# Patient Record
Sex: Female | Born: 1955 | Race: Black or African American | Hispanic: No | Smoking: Never smoker
Health system: Southern US, Community
[De-identification: ages and names within clinical notes are randomized; demographics above are authoritative.]

## PROBLEM LIST (undated history)

## (undated) DIAGNOSIS — I10 Essential (primary) hypertension: Secondary | ICD-10-CM

## (undated) DIAGNOSIS — E7800 Pure hypercholesterolemia, unspecified: Secondary | ICD-10-CM

## (undated) DIAGNOSIS — K219 Gastro-esophageal reflux disease without esophagitis: Secondary | ICD-10-CM

---

## 2014-12-27 ENCOUNTER — Emergency Department (HOSPITAL_BASED_OUTPATIENT_CLINIC_OR_DEPARTMENT_OTHER)
Admission: EM | Admit: 2014-12-27 | Discharge: 2014-12-27 | Disposition: A | Discharge status: Home / Self Care | Payer: No Typology Code available for payment source | Attending: Emergency Medicine | Admitting: Emergency Medicine

## 2014-12-27 ENCOUNTER — Encounter (HOSPITAL_BASED_OUTPATIENT_CLINIC_OR_DEPARTMENT_OTHER): Payer: Self-pay

## 2014-12-27 ENCOUNTER — Emergency Department (HOSPITAL_BASED_OUTPATIENT_CLINIC_OR_DEPARTMENT_OTHER): Payer: No Typology Code available for payment source

## 2014-12-27 DIAGNOSIS — S0993XA Unspecified injury of face, initial encounter: Secondary | ICD-10-CM | POA: Insufficient documentation

## 2014-12-27 DIAGNOSIS — S199XXA Unspecified injury of neck, initial encounter: Secondary | ICD-10-CM | POA: Diagnosis present

## 2014-12-27 DIAGNOSIS — S161XXA Strain of muscle, fascia and tendon at neck level, initial encounter: Secondary | ICD-10-CM | POA: Diagnosis not present

## 2014-12-27 DIAGNOSIS — S0990XA Unspecified injury of head, initial encounter: Secondary | ICD-10-CM | POA: Insufficient documentation

## 2014-12-27 DIAGNOSIS — I10 Essential (primary) hypertension: Secondary | ICD-10-CM | POA: Diagnosis not present

## 2014-12-27 DIAGNOSIS — S4991XA Unspecified injury of right shoulder and upper arm, initial encounter: Secondary | ICD-10-CM | POA: Insufficient documentation

## 2014-12-27 DIAGNOSIS — Y9389 Activity, other specified: Secondary | ICD-10-CM | POA: Insufficient documentation

## 2014-12-27 DIAGNOSIS — Z8639 Personal history of other endocrine, nutritional and metabolic disease: Secondary | ICD-10-CM | POA: Insufficient documentation

## 2014-12-27 DIAGNOSIS — Y92410 Unspecified street and highway as the place of occurrence of the external cause: Secondary | ICD-10-CM | POA: Insufficient documentation

## 2014-12-27 DIAGNOSIS — Y998 Other external cause status: Secondary | ICD-10-CM | POA: Insufficient documentation

## 2014-12-27 DIAGNOSIS — Z8719 Personal history of other diseases of the digestive system: Secondary | ICD-10-CM | POA: Insufficient documentation

## 2014-12-27 HISTORY — DX: Gastro-esophageal reflux disease without esophagitis: K21.9

## 2014-12-27 HISTORY — DX: Pure hypercholesterolemia, unspecified: E78.00

## 2014-12-27 HISTORY — DX: Essential (primary) hypertension: I10

## 2014-12-27 MED ORDER — TRAMADOL HCL 50 MG PO TABS: 50.0000 mg | ORAL_TABLET | Freq: Four times a day (QID) | ORAL | Status: AC | PRN

## 2014-12-27 MED ORDER — TRAMADOL HCL 50 MG PO TABS
50.0000 mg | ORAL_TABLET | Freq: Four times a day (QID) | ORAL | Status: DC | PRN
  Administered 2014-12-27: 50 mg via ORAL
  Filled 2014-12-27 (×2): qty 1

## 2014-12-27 MED ORDER — CYCLOBENZAPRINE HCL 10 MG PO TABS: 10.0000 mg | ORAL_TABLET | Freq: Two times a day (BID) | ORAL | Status: AC | PRN
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
